# Patient Record
Sex: Female | Born: 1950 | Hispanic: Yes | Marital: Married | Smoking: Never smoker
Health system: Southern US, Community
[De-identification: ages and names within clinical notes are randomized; demographics above are authoritative.]

## PROBLEM LIST (undated history)

## (undated) DIAGNOSIS — I959 Hypotension, unspecified: Secondary | ICD-10-CM

## (undated) HISTORY — PX: APPENDECTOMY: SHX54

## (undated) HISTORY — PX: HERNIA REPAIR: SHX51

## (undated) HISTORY — PX: CHOLECYSTECTOMY: SHX55

---

## 2016-03-26 ENCOUNTER — Other Ambulatory Visit: Payer: Self-pay

## 2016-03-26 ENCOUNTER — Emergency Department: Payer: Self-pay

## 2016-03-26 ENCOUNTER — Emergency Department
Admission: EM | Admit: 2016-03-26 | Discharge: 2016-03-26 | Disposition: A | Discharge status: Home / Self Care | Payer: Self-pay | Attending: Student in an Organized Health Care Education/Training Program | Admitting: Student in an Organized Health Care Education/Training Program

## 2016-03-26 DIAGNOSIS — R0902 Hypoxemia: Secondary | ICD-10-CM | POA: Insufficient documentation

## 2016-03-26 DIAGNOSIS — Z79899 Other long term (current) drug therapy: Secondary | ICD-10-CM | POA: Insufficient documentation

## 2016-03-26 DIAGNOSIS — M5440 Lumbago with sciatica, unspecified side: Secondary | ICD-10-CM

## 2016-03-26 DIAGNOSIS — M5431 Sciatica, right side: Secondary | ICD-10-CM

## 2016-03-26 DIAGNOSIS — M5441 Lumbago with sciatica, right side: Secondary | ICD-10-CM | POA: Insufficient documentation

## 2016-03-26 HISTORY — DX: Hypotension, unspecified: I95.9

## 2016-03-26 LAB — BASIC METABOLIC PANEL
Anion gap: 7 (ref 5–15)
BUN: 9 mg/dL (ref 6–20)
CALCIUM: 9.4 mg/dL (ref 8.9–10.3)
CHLORIDE: 107 mmol/L (ref 101–111)
CO2: 27 mmol/L (ref 22–32)
CREATININE: 0.57 mg/dL (ref 0.44–1.00)
GFR calc non Af Amer: >60 mL/min (ref >60)
Glucose, Bld: 109 mg/dL — ABNORMAL HIGH (ref 65–99)
Potassium: 3.6 mmol/L (ref 3.5–5.1)
Sodium: 141 mmol/L (ref 135–145)

## 2016-03-26 LAB — URINALYSIS COMPLETE WITH MICROSCOPIC (ARMC ONLY)
BACTERIA UA: NONE SEEN
BILIRUBIN URINE: NEGATIVE
GLUCOSE, UA: NEGATIVE mg/dL
KETONES UR: NEGATIVE mg/dL
LEUKOCYTES UA: NEGATIVE
Nitrite: NEGATIVE
PH: 8 (ref 5.0–8.0)
Protein, ur: NEGATIVE mg/dL
Specific Gravity, Urine: 1.005 (ref 1.005–1.030)

## 2016-03-26 LAB — CBC
HCT: 43.1 % (ref 35.0–47.0)
Hemoglobin: 14.3 g/dL (ref 12.0–16.0)
MCH: 27.2 pg (ref 26.0–34.0)
MCHC: 33.2 g/dL (ref 32.0–36.0)
MCV: 81.9 fL (ref 80.0–100.0)
PLATELETS: 316 10*3/uL (ref 150–440)
RBC: 5.26 MIL/uL — AB (ref 3.80–5.20)
RDW: 13.8 % (ref 11.5–14.5)
WBC: 6.1 10*3/uL (ref 3.6–11.0)

## 2016-03-26 LAB — TROPONIN I

## 2016-03-26 LAB — GLUCOSE, CAPILLARY: Glucose-Capillary: 88 mg/dL (ref 65–99)

## 2016-03-26 LAB — BRAIN NATRIURETIC PEPTIDE: B NATRIURETIC PEPTIDE 5: 48 pg/mL (ref 0.0–100.0)

## 2016-03-26 MED ORDER — TRAMADOL HCL 50 MG PO TABS: 50.0000 mg | ORAL_TABLET | Freq: Four times a day (QID) | ORAL | 0 refills | Status: AC | PRN

## 2016-03-26 MED ORDER — ONDANSETRON HCL 4 MG/2ML IJ SOLN
4.0000 mg | Freq: Once | INTRAMUSCULAR | Status: AC
  Administered 2016-03-26: 4 mg via INTRAVENOUS

## 2016-03-26 MED ORDER — IPRATROPIUM-ALBUTEROL 0.5-2.5 (3) MG/3ML IN SOLN
3.0000 mL | Freq: Once | RESPIRATORY_TRACT | Status: AC
  Administered 2016-03-26: 3 mL via RESPIRATORY_TRACT

## 2016-03-26 MED ORDER — HYDROMORPHONE HCL 1 MG/ML IJ SOLN
0.5000 mg | Freq: Once | INTRAMUSCULAR | Status: AC
  Administered 2016-03-26: 0.5 mg via INTRAVENOUS

## 2016-03-26 MED ORDER — HYDROMORPHONE HCL 1 MG/ML IJ SOLN: 0.5000 mg | Freq: Once | INTRAMUSCULAR | Status: DC

## 2016-03-26 MED ORDER — SODIUM CHLORIDE 0.9 % IV BOLUS (SEPSIS)
1000.0000 mL | Freq: Once | INTRAVENOUS | Status: AC
  Administered 2016-03-26: 1000 mL via INTRAVENOUS

## 2016-03-26 NOTE — ED Notes (Signed)
Pt ambulated with 2 RN's. No assistance needed. Pt had unsteady gait and reported he stomach was hurting. Pt's oxygen stats did not drop with ambulation. Stats did not drop below 99% on RA. MD updated.

## 2016-03-26 NOTE — ED Provider Notes (Signed)
[facility] Emergency Department Provider Note    First MD Initiated Contact with Patient 03/26/16 1256     (approximate)  I have reviewed the triage vital signs and the nursing notes.   HISTORY  Chief Complaint Back Pain    HPI Lizmary Malucelli Martins Da Edward JollySilva is a 65 y.o. female  who presents to room 75-10 minutes after being discharge after evaluation of back pain. Patient received 1.0 mg of Dilaudid IV. While being taking out to the waiting room the patient became lightheaded, nauseated and felt dizzy and slumped over in her chair. She was brought back to her room for repeat evaluation. Once laid flat the patient started feeling better. She denies any worsening pain. States that she felt very lightheaded and dizzy after receiving IV pain medication. She denies any chest pain or shortness of breath. No numbness or tingling.   Past Medical History:  Diagnosis Date  . Hypotension    No family history on file. Past Surgical History:  Procedure Laterality Date  . APPENDECTOMY    . CESAREAN SECTION    . CHOLECYSTECTOMY    . HERNIA REPAIR     There are no active problems to display for this patient.     Prior to Admission medications   Medication Sig Start Date End Date Taking? Authorizing Provider  DULoxetine (CYMBALTA) 60 MG capsule Take 60 mg by mouth daily.   Yes Historical Provider, MD  levothyroxine (SYNTHROID, LEVOTHROID) 50 MCG tablet Take 50 mcg by mouth daily. 03/05/16  Yes Historical Provider, MD  simvastatin (ZOCOR) 10 MG tablet Take 10 mg by mouth daily.   Yes Historical Provider, MD  topiramate (TOPAMAX) 50 MG tablet Take 50 mg by mouth 2 (two) times daily.   Yes Historical Provider, MD  UNABLE TO FIND Take 1.5 mg by mouth as needed. Med Name: Lexotan (bromazepam) Portuguese   Yes Historical Provider, MD  traMADol (ULTRAM) 50 MG tablet Take 1 tablet (50 mg total) by mouth every 6 (six) hours as needed. 03/26/16 03/26/17  Jeanmarie PlantJames A  McShane, MD    Allergies Patient has no known allergies.    Social History Social History  Substance Use Topics  . Smoking status: Never Smoker  . Smokeless tobacco: Never Used  . Alcohol use No    Review of Systems Patient denies headaches, rhinorrhea, blurry vision, numbness, shortness of breath, chest pain, edema, cough, abdominal pain, nausea, vomiting, diarrhea, dysuria, fevers, rashes or hallucinations unless otherwise stated above in HPI. ____________________________________________   PHYSICAL EXAM:  VITAL SIGNS: Vitals:   03/26/16 1500 03/26/16 1529  BP: 116/66 115/68  Pulse: 66 74  Resp: 17 16  Temp:      Constitutional: Alert but non tozxic appearing Eyes: Conjunctivae are normal. PERRL. EOMI. Head: Atraumatic. Nose: No congestion/rhinnorhea. Mouth/Throat: Mucous membranes are moist.  Oropharynx non-erythematous. Neck: No stridor. Painless ROM. No cervical spine tenderness to palpation Hematological/Lymphatic/Immunilogical: No cervical lymphadenopathy. Cardiovascular: Normal rate, regular rhythm. Grossly normal heart sounds.  Good peripheral circulation. Respiratory: Normal respiratory effort.  No retractions. Lungs CTAB. Gastrointestinal: Soft and nontender. No distention. No abdominal bruits. No CVA tenderness. Musculoskeletal: No lower extremity tenderness nor edema.  No joint effusions. Neurologic:  Normal speech and language. No gross focal neurologic deficits are appreciated. No gait instability. Skin:  Skin is warm, dry and intact. No rash noted.   ____________________________________________   LABS (all labs ordered are listed, but only abnormal results are displayed)  Results for orders placed or performed  during the hospital encounter of 03/26/16 (from the past 24 hour(s))  Basic metabolic panel     Status: Abnormal   Collection Time: 03/26/16 12:37 PM  Result Value Ref Range   Sodium 141 135 - 145 mmol/L   Potassium 3.6 3.5 - 5.1 mmol/L    Chloride 107 101 - 111 mmol/L   CO2 27 22 - 32 mmol/L   Glucose, Bld 109 (H) 65 - 99 mg/dL   BUN 9 6 - 20 mg/dL   Creatinine, Ser 1.61 0.44 - 1.00 mg/dL   Calcium 9.4 8.9 - 09.6 mg/dL   GFR calc non Af Amer >60 >60 mL/min   GFR calc Af Amer >60 >60 mL/min   Anion gap 7 5 - 15  CBC     Status: Abnormal   Collection Time: 03/26/16 12:37 PM  Result Value Ref Range   WBC 6.1 3.6 - 11.0 K/uL   RBC 5.26 (H) 3.80 - 5.20 MIL/uL   Hemoglobin 14.3 12.0 - 16.0 g/dL   HCT 04.5 40.9 - 81.1 %   MCV 81.9 80.0 - 100.0 fL   MCH 27.2 26.0 - 34.0 pg   MCHC 33.2 32.0 - 36.0 g/dL   RDW 91.4 78.2 - 95.6 %   Platelets 316 150 - 440 K/uL  Troponin I     Status: None   Collection Time: 03/26/16 12:37 PM  Result Value Ref Range   Troponin I <0.03 <0.03 ng/mL  Urinalysis complete, with microscopic     Status: Abnormal   Collection Time: 03/26/16  2:54 PM  Result Value Ref Range   Color, Urine STRAW (A) YELLOW   APPearance CLEAR (A) CLEAR   Glucose, UA NEGATIVE NEGATIVE mg/dL   Bilirubin Urine NEGATIVE NEGATIVE   Ketones, ur NEGATIVE NEGATIVE mg/dL   Specific Gravity, Urine 1.005 1.005 - 1.030   Hgb urine dipstick 1+ (A) NEGATIVE   pH 8.0 5.0 - 8.0   Protein, ur NEGATIVE NEGATIVE mg/dL   Nitrite NEGATIVE NEGATIVE   Leukocytes, UA NEGATIVE NEGATIVE   RBC / HPF 0-5 0 - 5 RBC/hpf   WBC, UA 0-5 0 - 5 WBC/hpf   Bacteria, UA NONE SEEN NONE SEEN   Squamous Epithelial / LPF 0-5 (A) NONE SEEN   ____________________________________________  EKG My review and personal interpretation at Time: 15:57   Indication: syncope  Rate: 60  Rhythm: sinus Axis: normal Other: lbbb, no acute changes from previous tracing ____________________________________________  RADIOLOGY  I personally reviewed all radiographic images ordered to evaluate for the above acute complaints and reviewed radiology reports and findings.  These findings were personally discussed with the patient.  Please see medical record for radiology  report. ____________________________________________   PROCEDURES  Procedure(s) performed: none Procedures    Critical Care performed: no ____________________________________________   INITIAL IMPRESSION / ASSESSMENT AND PLAN / ED COURSE  Pertinent labs & imaging results that were available during my care of the patient were reviewed by me and considered in my medical decision making (see chart for details).  DDX: syncope, heart failure, dehydration, overdose  Dayane Malucelli Martins Da Edward Jolly is a 65 y.o. who presents to the ED with dizziness, lightheadedness and near syncopal event. Patient arrives appears lightheaded and drowsy but nontoxic. She is not diaphoretic. She denies any chest pain or shortness of breath. Repeat EKG shows no dynamic changes from previous. Her lungs are clear bilaterally. We will order IV fluids and Zofran. I do suspect initially this is secondary to IV narcotic medications catching  up to her as she did endorses that the symptoms started after that. We'll continue to monitor her and reevaluate for cardiac etiology.  The patient will be placed on continuous pulse oximetry and telemetry for monitoring.  Laboratory evaluation will be sent to evaluate for the above complaints.     Clinical Course as of Mar 26 1905  Wed Mar 26, 2016  1732 Patient reassessed.  Appears to be improving.  Saturations normal. Having brief episodes where she desaturates to 88% but then responds appropriately.  Will ambulate and reassess.  [PR]  1739 Patient was able to ambulate down the hall with a steady gait and without desaturations.  Will PO challenge.  [PR]  1803 Patient was able to tolerate PO and was able to ambulate with a steady gait.  Episode most well explained by medication effect.  Blood work reassuring.  Have discussed with the patient and available family all diagnostics and treatments performed thus far and all questions were answered to the best of my ability. The  patient demonstrates understanding and agreement with plan.   [PR]    Clinical Course User Index [PR] Willy EddyPatrick Kinnick Maus, MD     ____________________________________________   FINAL CLINICAL IMPRESSION(S) / ED DIAGNOSES  Final diagnoses:  Sciatica of right side  Low back pain with sciatica, sciatica laterality unspecified, unspecified back pain laterality, unspecified chronicity      NEW MEDICATIONS STARTED DURING THIS VISIT:  New Prescriptions   TRAMADOL (ULTRAM) 50 MG TABLET    Take 1 tablet (50 mg total) by mouth every 6 (six) hours as needed.     Note:  This document was prepared using Dragon voice recognition software and may include unintentional dictation errors.    Willy EddyPatrick Briony Parveen, MD 03/26/16 [phone]

## 2016-03-26 NOTE — ED Provider Notes (Addendum)
[facility] Emergency Department Provider Note  ____________________________________________   I have reviewed the triage vital signs and the nursing notes.   HISTORY  Chief Complaint Back Pain    HPI Krystal Moyer is a 65 y.o. female with a long history of sciatica. Patient also has fibromyalgia. Chronic pain treated with Cymbalta. Krystal MaiersShegives me her history via the translator. Patient has had sciatic-like symptoms which are the same as she usually has, and lower back radiating down principally her right but also sometimes her left. No numbness no weakness. No incontinence of bowel or bladder. She denies dysuria or urinary frequency. She states that she's had this 2 or 3 times in the past. She tried taking her Cymbalta without relief. She denies any chest pain or shortness of breath. The pain is in the lower back. There is apparently some difficulty with translation when she first arrived. The patient states she has had no recent trauma. She states she has been picking up heavy twins and this may be why she has been having this.     Past Medical History:  Diagnosis Date  . Hypotension     There are no active problems to display for this patient.   Past Surgical History:  Procedure Laterality Date  . APPENDECTOMY    . CESAREAN SECTION    . CHOLECYSTECTOMY    . HERNIA REPAIR      Prior to Admission medications   Medication Sig Start Date End Date Taking? Authorizing Provider  DULoxetine (CYMBALTA) 60 MG capsule Take 60 mg by mouth daily.   Yes Historical Provider, MD  levothyroxine (SYNTHROID, LEVOTHROID) 50 MCG tablet Take 50 mcg by mouth daily. 03/05/16  Yes Historical Provider, MD  simvastatin (ZOCOR) 10 MG tablet Take 10 mg by mouth daily.   Yes Historical Provider, MD  topiramate (TOPAMAX) 50 MG tablet Take 50 mg by mouth 2 (two) times daily.   Yes Historical Provider, MD  UNABLE TO FIND Take 1.5 mg by mouth as needed. Med Name:  Lexotan (bromazepam) Portuguese   Yes Historical Provider, MD    Allergies Patient has no known allergies.  No family history on file.  Social History Social History  Substance Use Topics  . Smoking status: Never Smoker  . Smokeless tobacco: Never Used  . Alcohol use No    Review of Systems Constitutional: No fever/chills Eyes: No visual changes. ENT: No sore throat. No stiff neck no neck pain Cardiovascular: Denies chest pain. Respiratory: Denies shortness of breath. Gastrointestinal:   no vomiting.  No diarrhea.  No constipation. Genitourinary: Negative for dysuria. Musculoskeletal: Negative lower extremity swelling Skin: Negative for rash. Neurological: Negative for severe headaches, focal weakness or numbness. 10-point ROS otherwise negative.  ____________________________________________   PHYSICAL EXAM:  VITAL SIGNS: ED Triage Vitals  Enc Vitals Group     BP 03/26/16 1230 116/68     Pulse Rate 03/26/16 1230 73     Resp 03/26/16 1230 18     Temp 03/26/16 1230 97.4 F (36.3 C)     Temp Source 03/26/16 1230 Oral     SpO2 03/26/16 1230 97 %     Weight 03/26/16 1232 120 lb (54.4 kg)     Height 03/26/16 1232 4\' 6"  (1.372 m)     Head Circumference --      Peak Flow --      Pain Score 03/26/16 1233 10     Pain Loc --      Pain Edu? --  Excl. in GC? --     Constitutional: Alert and oriented. Well appearing and in no acute distress. Eyes: Conjunctivae are normal. PERRL. EOMI. Head: Atraumatic. Nose: No congestion/rhinnorhea. Mouth/Throat: Mucous membranes are moist.  Oropharynx non-erythematous. Neck: No stridor.   Nontender with no meningismus Cardiovascular: Normal rate, regular rhythm. Grossly normal heart sounds.  Good peripheral circulation. Respiratory: Normal respiratory effort.  No retractions. Lungs CTAB. Abdominal: Soft and nontender. No distention. No guarding no rebound Back:  ThereIs minimal tenderness palpation paraspinal muscles bilaterally  in the lumbar region.  there is no midline tenderness there are no lesions noted. there is no CVA tenderness Musculoskeletal: No lower extremity tenderness, no upper extremity tenderness. No joint effusions, no DVT signs strong distal pulses no edema Neurologic:  Normal speech and language. No gross focal neurologic deficits are appreciated. There is no saddle anesthesia reflexes are 2+ Skin:  Skin is warm, dry and intact. No rash noted. Psychiatric: Mood and affect are normal. Speech and behavior are normal.  ____________________________________________   LABS (all labs ordered are listed, but only abnormal results are displayed)  Labs Reviewed  BASIC METABOLIC PANEL - Abnormal; Notable for the following:       Result Value   Glucose, Bld 109 (*)    All other components within normal limits  CBC - Abnormal; Notable for the following:    RBC 5.26 (*)    All other components within normal limits  TROPONIN I  URINALYSIS COMPLETEWITH MICROSCOPIC ([facility] ONLY)   ____________________________________________  EKG  I personally interpreted any EKGs ordered by me or triage Patient with a left bundle branch block not known to be new, rate 77 bpm ____________________________________________  RADIOLOGY  I reviewed any imaging ordered by me or triage that were performed during my shift and, if possible, patient and/or family made aware of any abnormal findings. ____________________________________________   PROCEDURES  Procedure(s) performed: None  Procedures  Critical Care performed: None  ____________________________________________   INITIAL IMPRESSION / ASSESSMENT AND PLAN / ED COURSE  Pertinent labs & imaging results that were available during my care of the patient were reviewed by me and considered in my medical decision making (see chart for details).  Patient with chronic recurrent sciatica presents with sciatic-like symptoms. We're giving her pain medications and we  will treat her at home with pain medications. She is neurologically intact. Nothing to suggest cauda equina syndrome. No abdominal discomfort. Nothing to suggest AAA or vascular catastrophe including dissection. Patient is very well-appearing. We will check a urine because of her age. Apparently she did just give us a urine sample. If that is negative is my hope that we can safely get her home with pain medication and close outpatient follow-up. All this is been explained to her extensively and all questions asked via an interpreter.  ----------------------------------------- 3:34 PM on 03/26/2016 -----------------------------------------  She has no cough or chest pain or shortness of breath, there is some atelectatic-appearing changes and a chest x-ray which do not appear to be consistent with pneumonia. Clearly, that's really does not fit the picture. Patient feels much better after pain medication, and is eager to go home. Findings explained with the help of a translator. Extensive return precautions and follow-up given and understood.  Clinical Course    ____________________________________________   FINAL CLINICAL IMPRESSION(S) / ED DIAGNOSES  Final diagnoses:  None      This chart was dictated using voice recognition software.  Despite best efforts to proofread,  errors can occur which  can change meaning.      Jeanmarie Plant, MD 03/26/16 1512    Jeanmarie Plant, MD 03/26/16 [phone]

## 2016-03-26 NOTE — ED Notes (Signed)
CBG done with result of 88. Apple juice given to pt.

## 2016-03-26 NOTE — ED Notes (Signed)
Hooked pt up to monitor and gave her a blanket. 

## 2016-03-26 NOTE — ED Notes (Signed)
Per tech who brought pt back, [facility], reported weakness and dizziness.

## 2016-03-26 NOTE — ED Notes (Signed)
Pt brought back to room from lobby.  

## 2016-03-26 NOTE — ED Notes (Signed)
Patient transported to X-ray 

## 2016-03-26 NOTE — ED Notes (Signed)
Dr Robinson at bedside 

## 2016-03-26 NOTE — ED Notes (Signed)
Pt removed from mask and nasal canula per MD order. Pt is in NAD at this time with no increased WOB or SOB noted. Pts vitals are WNL at this time, will continue to monitor.

## 2016-03-26 NOTE — ED Notes (Signed)
Pt's o2sat at 100% w/ 2L oxygen, pt reports feeling better

## 2016-03-26 NOTE — ED Notes (Addendum)
Patient denies any chest pain and states that it is her back that has been bothering her for about a week. This was translated using a portuguese interpreter.

## 2016-03-26 NOTE — ED Notes (Signed)
Pt desat to 80s, returned to 90s within 2 minutes, dr. Roxan Hockeyobinson informed

## 2016-03-26 NOTE — ED Notes (Signed)
Patient states she took her cymbalta this morning as well as the medicine [place] for anxiety.  Patient states she also has been diagnosed with fibromyalgia in the past but that she stays up to date on all her medications.  H/o of sciatica, but according to the patient this feels different.

## 2016-03-26 NOTE — ED Triage Notes (Signed)
Pt c/o chest pain over the past few days radiating into the back.

## 2016-03-26 NOTE — ED Notes (Signed)
2% of O2 placed on pt. Nasal canula.

## 2017-12-11 IMAGING — CR DG CHEST 2V
1 series · 2 of 2 positions shown · non-contrast
Comparison: No prior.

CLINICAL DATA: Back pain.  Neck pain .

EXAM:
CHEST  2 VIEW

[Series 1: dg chest 2 view · 0.14mm/px · 2 of 2 slices shown]
[im 1/2]
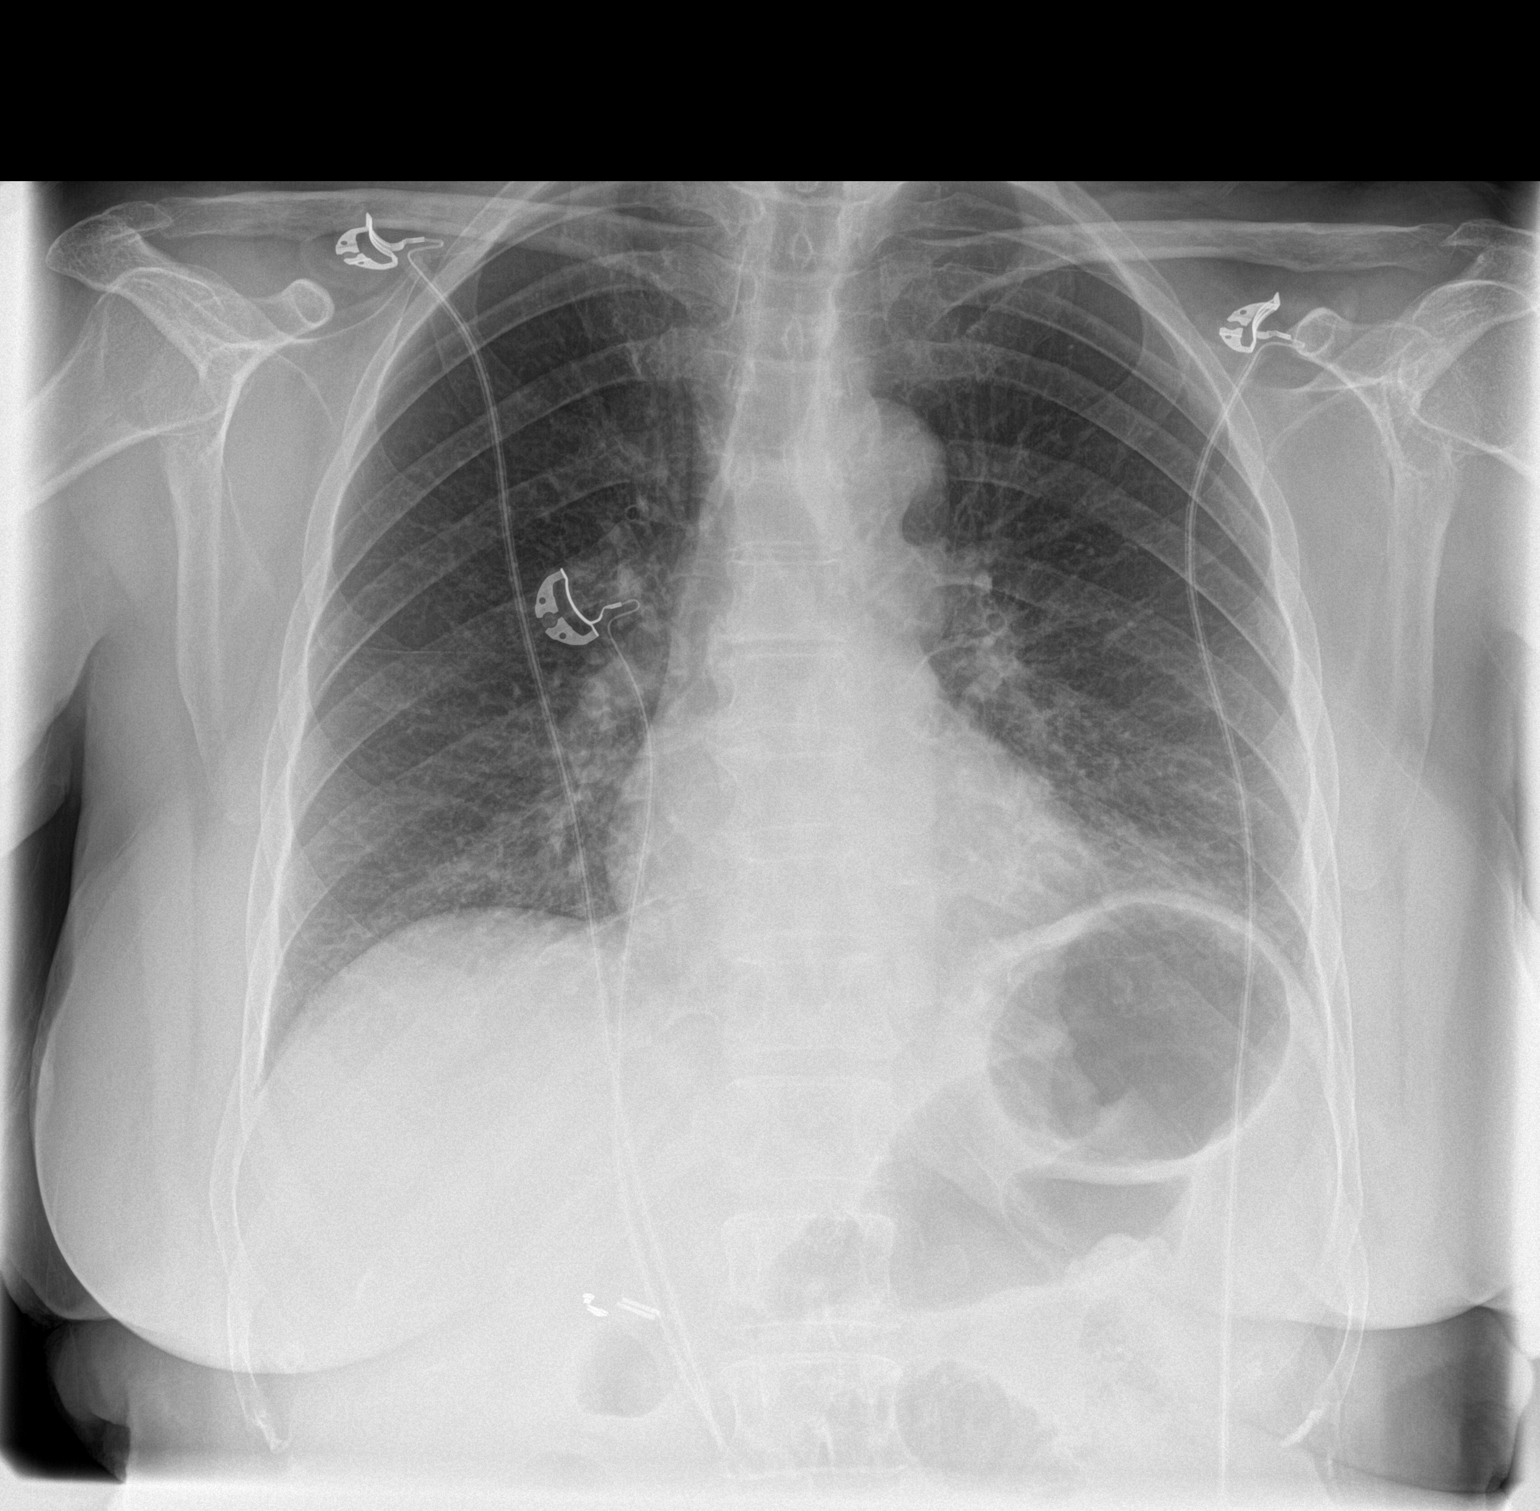
[im 2/2]
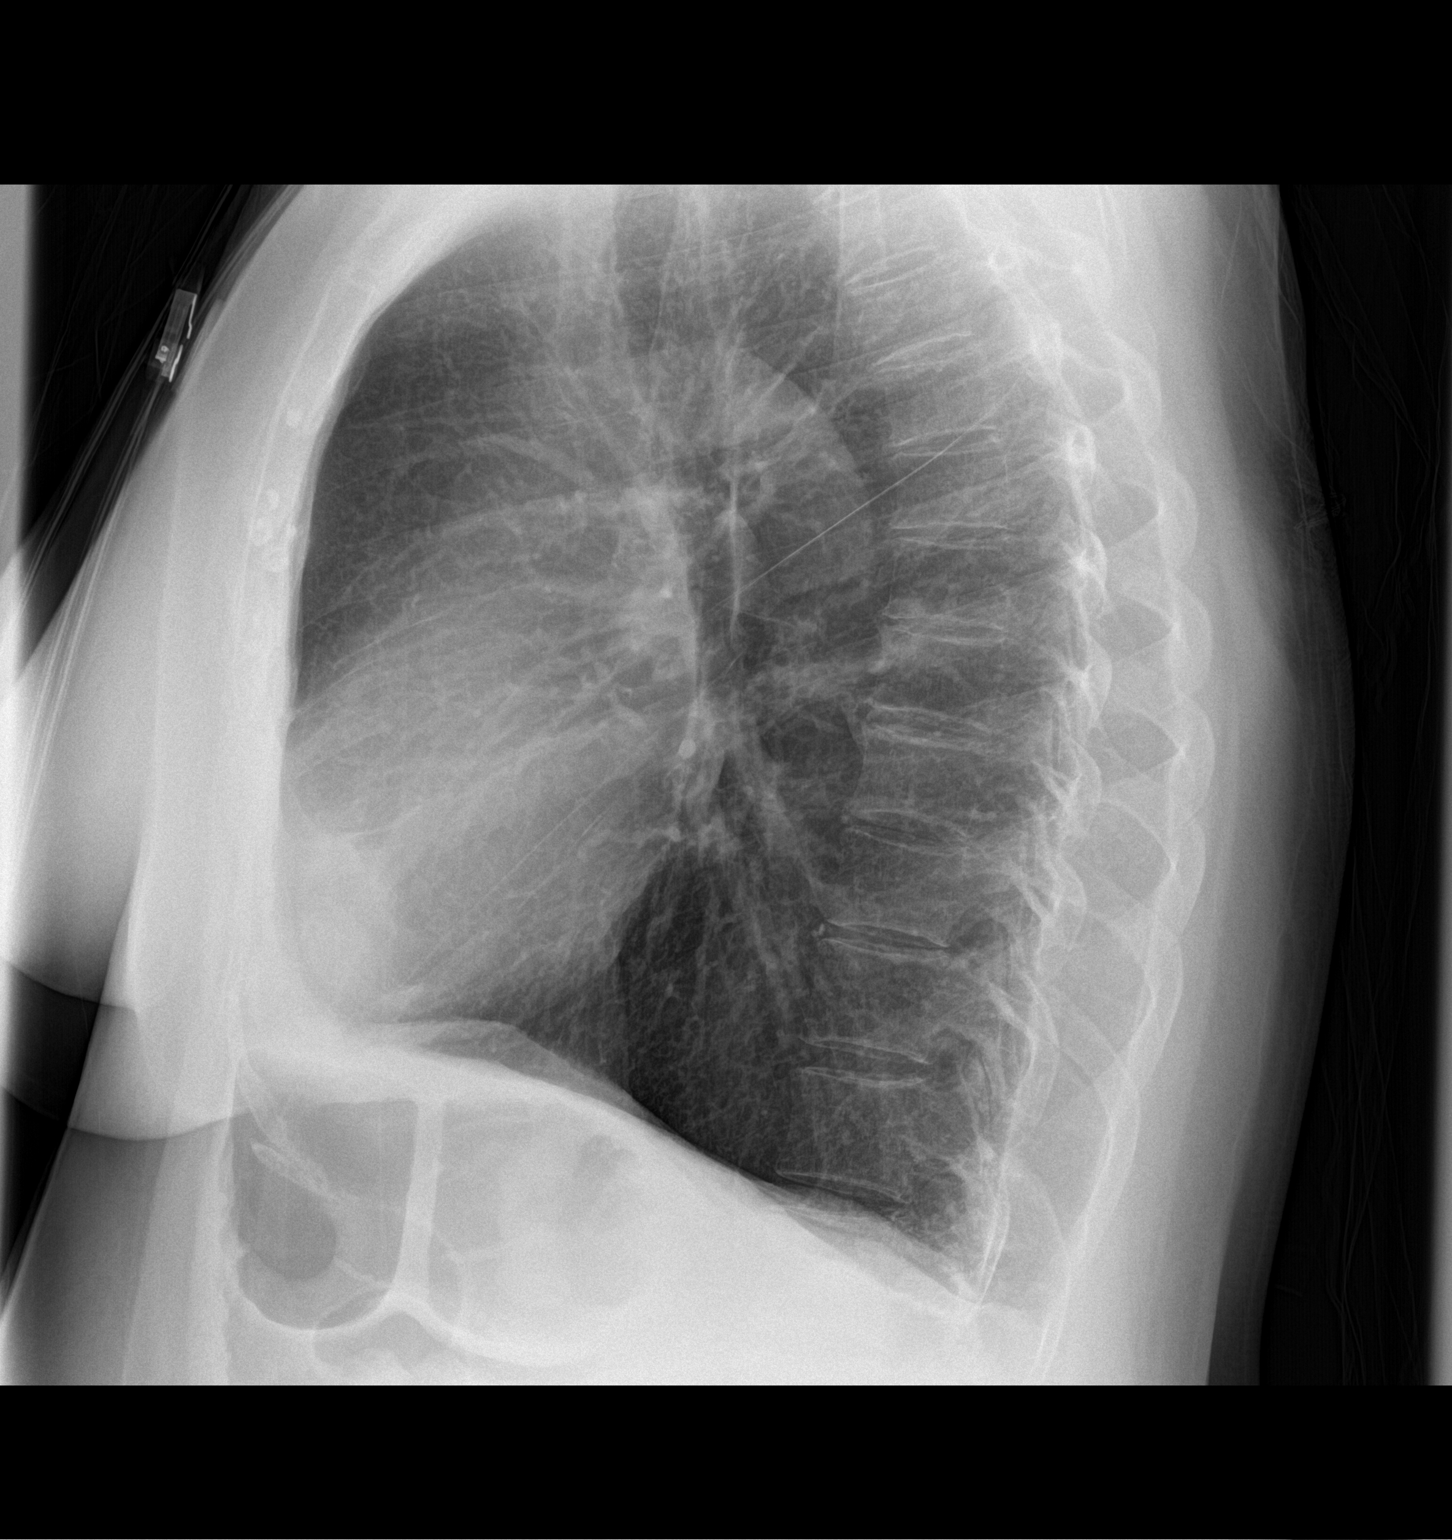

[2 of 2 positions shown; findings below may reference images not displayed]

FINDINGS: Mediastinum and hilar structures are normal. Low lung volumes with
mild bibasilar infiltrates. No pleural effusion or pneumothorax.
Degenerative changes thoracic spine. Surgical clips right upper
quadrant .
IMPRESSION: Low lung volumes with mild bibasilar infiltrates.

## 2017-12-11 IMAGING — DX DG CHEST 1V
1 series · 1 of 1 positions shown · non-contrast
Comparison: 03/26/2016 chest radiograph.

CLINICAL DATA: Syncope.  Hypoxia.

EXAM:
CHEST 1 VIEW

[chest ap]
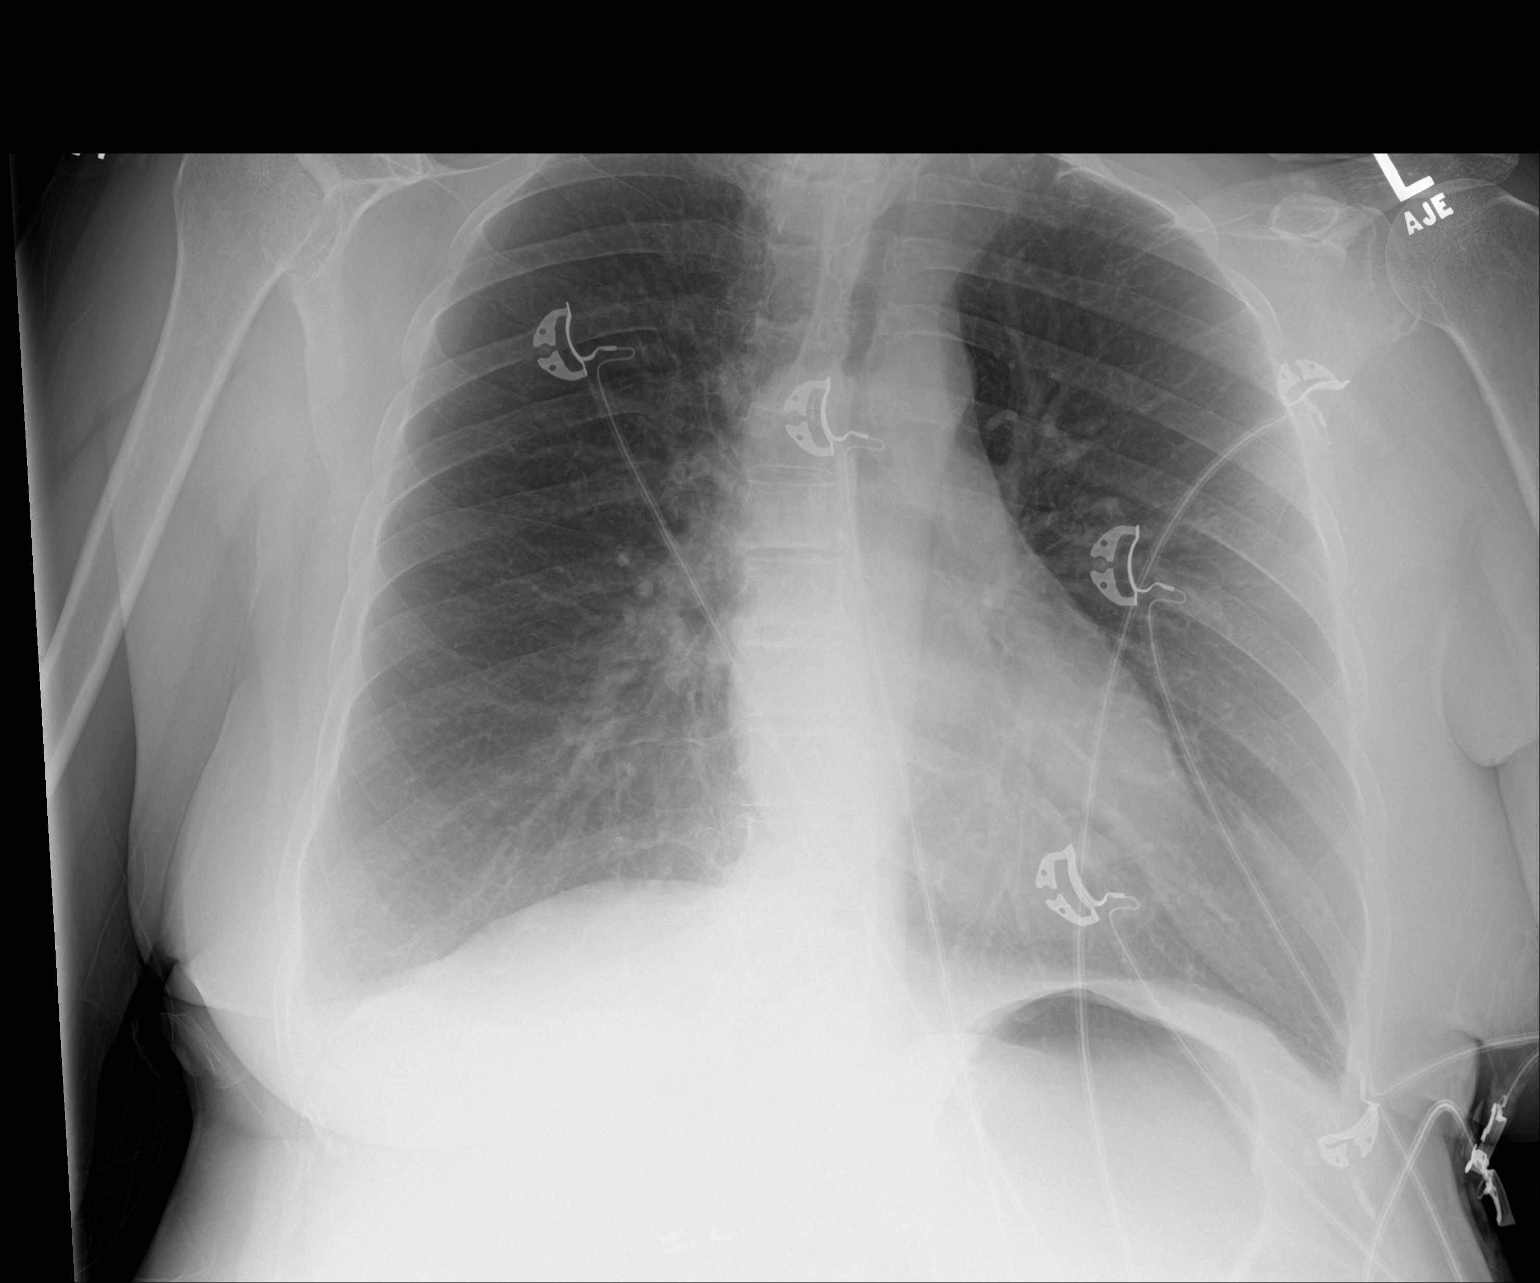

[1 of 1 positions shown; findings below may reference images not displayed]

FINDINGS: Stable cardiomediastinal silhouette with normal heart size. No
pneumothorax. Mild blunting of the costophrenic angles. No pulmonary
edema. No acute consolidative airspace disease.
IMPRESSION: 1. Normal heart size. No pulmonary edema. No active pulmonary
disease.
2. Mild blunting of both costophrenic angles, cannot exclude trace
pleural effusions.
